# Patient Record
Sex: Female | Born: 1970 | Race: White | Hispanic: No | Marital: Married | Smoking: Former smoker
Health system: Southern US, Community
[De-identification: ages and names within clinical notes are randomized; demographics above are authoritative.]

## PROBLEM LIST (undated history)

## (undated) DIAGNOSIS — K219 Gastro-esophageal reflux disease without esophagitis: Secondary | ICD-10-CM

## (undated) DIAGNOSIS — R51 Headache: Secondary | ICD-10-CM

## (undated) DIAGNOSIS — R519 Headache, unspecified: Secondary | ICD-10-CM

## (undated) DIAGNOSIS — D649 Anemia, unspecified: Secondary | ICD-10-CM

---

## 2009-10-03 HISTORY — PX: ANTERIOR CRUCIATE LIGAMENT REPAIR: SHX115

## 2010-02-26 ENCOUNTER — Ambulatory Visit: Payer: Self-pay | Admitting: Obstetrics and Gynecology

## 2010-12-21 ENCOUNTER — Ambulatory Visit: Payer: Self-pay

## 2011-01-24 ENCOUNTER — Ambulatory Visit: Payer: Self-pay | Admitting: Unknown Physician Specialty

## 2011-11-25 ENCOUNTER — Ambulatory Visit: Payer: Self-pay | Admitting: Obstetrics and Gynecology

## 2012-11-20 ENCOUNTER — Ambulatory Visit: Payer: Self-pay | Admitting: Internal Medicine

## 2014-07-30 ENCOUNTER — Ambulatory Visit: Payer: Self-pay | Admitting: Obstetrics and Gynecology

## 2015-12-24 ENCOUNTER — Other Ambulatory Visit: Payer: Self-pay | Admitting: Physical Medicine and Rehabilitation

## 2015-12-24 DIAGNOSIS — M5412 Radiculopathy, cervical region: Secondary | ICD-10-CM

## 2016-01-07 ENCOUNTER — Ambulatory Visit
Admission: RE | Admit: 2016-01-07 | Discharge: 2016-01-07 | Disposition: A | Discharge status: Home / Self Care | Payer: Managed Care, Other (non HMO) | Source: Ambulatory Visit | Attending: Physical Medicine and Rehabilitation | Admitting: Physical Medicine and Rehabilitation

## 2016-01-07 DIAGNOSIS — M4802 Spinal stenosis, cervical region: Secondary | ICD-10-CM | POA: Diagnosis not present

## 2016-01-07 DIAGNOSIS — M5412 Radiculopathy, cervical region: Secondary | ICD-10-CM

## 2016-01-07 DIAGNOSIS — M50221 Other cervical disc displacement at C4-C5 level: Secondary | ICD-10-CM | POA: Insufficient documentation

## 2016-08-11 ENCOUNTER — Other Ambulatory Visit: Payer: Self-pay | Admitting: Obstetrics and Gynecology

## 2016-08-11 DIAGNOSIS — Z1231 Encounter for screening mammogram for malignant neoplasm of breast: Secondary | ICD-10-CM

## 2016-08-15 ENCOUNTER — Ambulatory Visit
Admission: RE | Admit: 2016-08-15 | Discharge: 2016-08-15 | Disposition: A | Discharge status: Home / Self Care | Payer: Managed Care, Other (non HMO) | Source: Ambulatory Visit | Attending: Obstetrics and Gynecology | Admitting: Obstetrics and Gynecology

## 2016-08-15 ENCOUNTER — Encounter (INDEPENDENT_AMBULATORY_CARE_PROVIDER_SITE_OTHER): Payer: Self-pay

## 2016-08-15 DIAGNOSIS — Z1231 Encounter for screening mammogram for malignant neoplasm of breast: Secondary | ICD-10-CM | POA: Diagnosis present

## 2017-10-30 ENCOUNTER — Other Ambulatory Visit: Payer: Self-pay | Admitting: Orthopedic Surgery

## 2017-11-01 ENCOUNTER — Other Ambulatory Visit: Payer: Self-pay

## 2017-11-01 ENCOUNTER — Encounter (HOSPITAL_COMMUNITY): Payer: Self-pay | Admitting: *Deleted

## 2017-11-01 MED ORDER — CEFAZOLIN SODIUM 10 G IJ SOLR
3.0000 g | INTRAMUSCULAR | Status: DC
  Filled 2017-11-01: qty 3000

## 2017-11-02 ENCOUNTER — Ambulatory Visit (HOSPITAL_COMMUNITY): Payer: Managed Care, Other (non HMO) | Admitting: Anesthesiology

## 2017-11-02 ENCOUNTER — Ambulatory Visit (HOSPITAL_COMMUNITY)
Admission: RE | Admit: 2017-11-02 | Discharge: 2017-11-02 | Disposition: A | Discharge status: Home / Self Care | Payer: Managed Care, Other (non HMO) | Source: Ambulatory Visit | Attending: Orthopedic Surgery | Admitting: Orthopedic Surgery

## 2017-11-02 ENCOUNTER — Ambulatory Visit (HOSPITAL_COMMUNITY)
Admission: RE | Disposition: A | Discharge status: Home / Self Care | Payer: Self-pay | Source: Ambulatory Visit | Attending: Orthopedic Surgery

## 2017-11-02 ENCOUNTER — Encounter (HOSPITAL_COMMUNITY): Payer: Self-pay

## 2017-11-02 ENCOUNTER — Ambulatory Visit (HOSPITAL_COMMUNITY): Payer: Managed Care, Other (non HMO)

## 2017-11-02 DIAGNOSIS — Z803 Family history of malignant neoplasm of breast: Secondary | ICD-10-CM | POA: Insufficient documentation

## 2017-11-02 DIAGNOSIS — Z87891 Personal history of nicotine dependence: Secondary | ICD-10-CM | POA: Diagnosis not present

## 2017-11-02 DIAGNOSIS — Z91013 Allergy to seafood: Secondary | ICD-10-CM | POA: Diagnosis not present

## 2017-11-02 DIAGNOSIS — M5116 Intervertebral disc disorders with radiculopathy, lumbar region: Secondary | ICD-10-CM | POA: Diagnosis present

## 2017-11-02 DIAGNOSIS — Z9889 Other specified postprocedural states: Secondary | ICD-10-CM | POA: Diagnosis not present

## 2017-11-02 DIAGNOSIS — Z91018 Allergy to other foods: Secondary | ICD-10-CM | POA: Diagnosis not present

## 2017-11-02 DIAGNOSIS — Z419 Encounter for procedure for purposes other than remedying health state, unspecified: Secondary | ICD-10-CM

## 2017-11-02 DIAGNOSIS — Z01818 Encounter for other preprocedural examination: Secondary | ICD-10-CM

## 2017-11-02 DIAGNOSIS — M5410 Radiculopathy, site unspecified: Secondary | ICD-10-CM

## 2017-11-02 HISTORY — DX: Headache, unspecified: R51.9

## 2017-11-02 HISTORY — PX: LUMBAR LAMINECTOMY/DECOMPRESSION MICRODISCECTOMY: SHX5026

## 2017-11-02 HISTORY — DX: Anemia, unspecified: D64.9

## 2017-11-02 HISTORY — DX: Headache: R51

## 2017-11-02 HISTORY — DX: Gastro-esophageal reflux disease without esophagitis: K21.9

## 2017-11-02 LAB — CBC WITH DIFFERENTIAL/PLATELET
Basophils Absolute: 0 10*3/uL (ref 0.0–0.1)
Basophils Relative: 0 %
EOS PCT: 1 %
Eosinophils Absolute: 0.1 10*3/uL (ref 0.0–0.7)
HCT: 43.8 % (ref 36.0–46.0)
Hemoglobin: 14.5 g/dL (ref 12.0–15.0)
LYMPHS ABS: 2 10*3/uL (ref 0.7–4.0)
LYMPHS PCT: 24 %
MCH: 30.5 pg (ref 26.0–34.0)
MCHC: 33.1 g/dL (ref 30.0–36.0)
MCV: 92 fL (ref 78.0–100.0)
MONO ABS: 0.5 10*3/uL (ref 0.1–1.0)
MONOS PCT: 5 %
Neutro Abs: 5.9 10*3/uL (ref 1.7–7.7)
Neutrophils Relative %: 70 %
PLATELETS: 335 10*3/uL (ref 150–400)
RBC: 4.76 MIL/uL (ref 3.87–5.11)
RDW: 12.9 % (ref 11.5–15.5)
WBC: 8.5 10*3/uL (ref 4.0–10.5)

## 2017-11-02 LAB — SURGICAL PCR SCREEN
MRSA, PCR: NEGATIVE
STAPHYLOCOCCUS AUREUS: NEGATIVE

## 2017-11-02 LAB — COMPREHENSIVE METABOLIC PANEL
ALT: 15 U/L (ref 14–54)
AST: 18 U/L (ref 15–41)
Albumin: 4.1 g/dL (ref 3.5–5.0)
Alkaline Phosphatase: 70 U/L (ref 38–126)
Anion gap: 10 (ref 5–15)
BUN: 11 mg/dL (ref 6–20)
CALCIUM: 9.2 mg/dL (ref 8.9–10.3)
CO2: 24 mmol/L (ref 22–32)
CREATININE: 0.96 mg/dL (ref 0.44–1.00)
Chloride: 104 mmol/L (ref 101–111)
Glucose, Bld: 96 mg/dL (ref 65–99)
Potassium: 3.6 mmol/L (ref 3.5–5.1)
Sodium: 138 mmol/L (ref 135–145)
Total Bilirubin: 0.7 mg/dL (ref 0.3–1.2)
Total Protein: 6.8 g/dL (ref 6.5–8.1)

## 2017-11-02 LAB — URINALYSIS, ROUTINE W REFLEX MICROSCOPIC
Bilirubin Urine: NEGATIVE
Glucose, UA: NEGATIVE mg/dL
KETONES UR: NEGATIVE mg/dL
Leukocytes, UA: NEGATIVE
Nitrite: NEGATIVE
PROTEIN: NEGATIVE mg/dL
Specific Gravity, Urine: 1.028 (ref 1.005–1.030)
pH: 5 (ref 5.0–8.0)

## 2017-11-02 LAB — APTT: aPTT: 37 seconds — ABNORMAL HIGH (ref 24–36)

## 2017-11-02 LAB — TYPE AND SCREEN
ABO/RH(D): O POS
Antibody Screen: NEGATIVE

## 2017-11-02 LAB — PROTIME-INR
INR: 0.99
PROTHROMBIN TIME: 13 s (ref 11.4–15.2)

## 2017-11-02 LAB — ABO/RH: ABO/RH(D): O POS

## 2017-11-02 LAB — POCT PREGNANCY, URINE: Preg Test, Ur: NEGATIVE

## 2017-11-02 SURGERY — LUMBAR LAMINECTOMY/DECOMPRESSION MICRODISCECTOMY
Anesthesia: General

## 2017-11-02 MED ORDER — BUPIVACAINE-EPINEPHRINE (PF) 0.5% -1:200000 IJ SOLN
INTRAMUSCULAR | Status: AC
  Filled 2017-11-02: qty 30

## 2017-11-02 MED ORDER — FENTANYL CITRATE (PF) 250 MCG/5ML IJ SOLN
INTRAMUSCULAR | Status: AC
  Filled 2017-11-02: qty 5

## 2017-11-02 MED ORDER — PROPOFOL 10 MG/ML IV BOLUS
INTRAVENOUS | Status: DC | PRN
  Administered 2017-11-02: 50 mg via INTRAVENOUS
  Administered 2017-11-02: 150 mg via INTRAVENOUS

## 2017-11-02 MED ORDER — MIDAZOLAM HCL 5 MG/5ML IJ SOLN
INTRAMUSCULAR | Status: DC | PRN
  Administered 2017-11-02: 2 mg via INTRAVENOUS

## 2017-11-02 MED ORDER — MUPIROCIN 2 % EX OINT
TOPICAL_OINTMENT | CUTANEOUS | Status: AC
  Administered 2017-11-02: 1 via TOPICAL
  Filled 2017-11-02: qty 22

## 2017-11-02 MED ORDER — CHLORHEXIDINE GLUCONATE 4 % EX LIQD: 60.0000 mL | Freq: Once | CUTANEOUS | Status: DC

## 2017-11-02 MED ORDER — MIDAZOLAM HCL 2 MG/2ML IJ SOLN
INTRAMUSCULAR | Status: AC
  Filled 2017-11-02: qty 2

## 2017-11-02 MED ORDER — ONDANSETRON HCL 4 MG/2ML IJ SOLN
INTRAMUSCULAR | Status: AC
  Filled 2017-11-02: qty 2

## 2017-11-02 MED ORDER — SURGIFOAM 100 EX MISC
CUTANEOUS | Status: DC | PRN
  Administered 2017-11-02: 16:00:00 via TOPICAL

## 2017-11-02 MED ORDER — MUPIROCIN 2 % EX OINT
1.0000 "application " | TOPICAL_OINTMENT | Freq: Once | CUTANEOUS | Status: AC
  Administered 2017-11-02: 1 via TOPICAL

## 2017-11-02 MED ORDER — FENTANYL CITRATE (PF) 100 MCG/2ML IJ SOLN
25.0000 ug | INTRAMUSCULAR | Status: DC | PRN
  Administered 2017-11-02 (×2): 50 ug via INTRAVENOUS

## 2017-11-02 MED ORDER — LIDOCAINE 2% (20 MG/ML) 5 ML SYRINGE
INTRAMUSCULAR | Status: AC
  Filled 2017-11-02: qty 5

## 2017-11-02 MED ORDER — FENTANYL CITRATE (PF) 100 MCG/2ML IJ SOLN
INTRAMUSCULAR | Status: AC
  Filled 2017-11-02: qty 2

## 2017-11-02 MED ORDER — FENTANYL CITRATE (PF) 100 MCG/2ML IJ SOLN
INTRAMUSCULAR | Status: DC | PRN
  Administered 2017-11-02 (×2): 50 ug via INTRAVENOUS
  Administered 2017-11-02: 150 ug via INTRAVENOUS
  Administered 2017-11-02: 50 ug via INTRAVENOUS

## 2017-11-02 MED ORDER — ACETAMINOPHEN 10 MG/ML IV SOLN
1000.0000 mg | INTRAVENOUS | Status: AC
  Administered 2017-11-02: 1000 mg via INTRAVENOUS
  Filled 2017-11-02: qty 100

## 2017-11-02 MED ORDER — LIDOCAINE HCL (CARDIAC) 20 MG/ML IV SOLN
INTRAVENOUS | Status: DC | PRN
  Administered 2017-11-02: 100 mg via INTRAVENOUS

## 2017-11-02 MED ORDER — HEMOSTATIC AGENTS (NO CHARGE) OPTIME
TOPICAL | Status: DC | PRN
  Administered 2017-11-02: 1 via TOPICAL

## 2017-11-02 MED ORDER — METHYLENE BLUE 0.5 % INJ SOLN
INTRAVENOUS | Status: DC | PRN
  Administered 2017-11-02: .03 mL

## 2017-11-02 MED ORDER — SUGAMMADEX SODIUM 200 MG/2ML IV SOLN
INTRAVENOUS | Status: DC | PRN
  Administered 2017-11-02: 206.8 mg via INTRAVENOUS

## 2017-11-02 MED ORDER — METHYLPREDNISOLONE ACETATE 40 MG/ML IJ SUSP
INTRAMUSCULAR | Status: DC | PRN
  Administered 2017-11-02: 40 mg

## 2017-11-02 MED ORDER — DEXAMETHASONE SODIUM PHOSPHATE 10 MG/ML IJ SOLN
INTRAMUSCULAR | Status: AC
  Filled 2017-11-02: qty 1

## 2017-11-02 MED ORDER — ONDANSETRON HCL 4 MG/2ML IJ SOLN
INTRAMUSCULAR | Status: DC | PRN
  Administered 2017-11-02: 4 mg via INTRAVENOUS

## 2017-11-02 MED ORDER — THROMBIN 20000 UNITS EX SOLR
CUTANEOUS | Status: AC
  Filled 2017-11-02: qty 20000

## 2017-11-02 MED ORDER — BUPIVACAINE-EPINEPHRINE 0.5% -1:200000 IJ SOLN
INTRAMUSCULAR | Status: DC | PRN
  Administered 2017-11-02: 10 mL

## 2017-11-02 MED ORDER — CEFAZOLIN SODIUM-DEXTROSE 2-3 GM-%(50ML) IV SOLR
INTRAVENOUS | Status: DC | PRN
Start: 2017-11-02 — End: 2017-11-02
  Administered 2017-11-02: 2 g via INTRAVENOUS

## 2017-11-02 MED ORDER — SCOPOLAMINE 1 MG/3DAYS TD PT72
MEDICATED_PATCH | TRANSDERMAL | Status: AC
  Filled 2017-11-02: qty 1

## 2017-11-02 MED ORDER — METHYLPREDNISOLONE ACETATE 40 MG/ML IJ SUSP
INTRAMUSCULAR | Status: AC
  Filled 2017-11-02: qty 1

## 2017-11-02 MED ORDER — DEXAMETHASONE SODIUM PHOSPHATE 10 MG/ML IJ SOLN
INTRAMUSCULAR | Status: DC | PRN
  Administered 2017-11-02: 10 mg via INTRAVENOUS

## 2017-11-02 MED ORDER — BUPIVACAINE LIPOSOME 1.3 % IJ SUSP
20.0000 mL | INTRAMUSCULAR | Status: DC
  Filled 2017-11-02: qty 20

## 2017-11-02 MED ORDER — ROCURONIUM BROMIDE 100 MG/10ML IV SOLN
INTRAVENOUS | Status: DC | PRN
  Administered 2017-11-02: 50 mg via INTRAVENOUS

## 2017-11-02 MED ORDER — BUPIVACAINE LIPOSOME 1.3 % IJ SUSP
INTRAMUSCULAR | Status: DC | PRN
  Administered 2017-11-02: 20 mL

## 2017-11-02 MED ORDER — LACTATED RINGERS IV SOLN
INTRAVENOUS | Status: DC
  Administered 2017-11-02 (×2): via INTRAVENOUS

## 2017-11-02 MED ORDER — METHYLENE BLUE 0.5 % INJ SOLN
INTRAVENOUS | Status: AC
  Filled 2017-11-02: qty 10

## 2017-11-02 MED ORDER — PROPOFOL 10 MG/ML IV BOLUS
INTRAVENOUS | Status: AC
  Filled 2017-11-02: qty 40

## 2017-11-02 SURGICAL SUPPLY — 71 items
BENZOIN TINCTURE PRP APPL 2/3 (GAUZE/BANDAGES/DRESSINGS) ×3 IMPLANT
BUR NEURO DRILL SOFT 3.0X3.8M (BURR) ×3 IMPLANT
BUR ROUND PRECISION 4.0 (BURR) IMPLANT
BUR ROUND PRECISION 4.0MM (BURR)
CANISTER SUCT 3000ML PPV (MISCELLANEOUS) ×3 IMPLANT
CARTRIDGE OIL MAESTRO DRILL (MISCELLANEOUS) ×1 IMPLANT
CLOSURE WOUND 1/2 X4 (GAUZE/BANDAGES/DRESSINGS) ×1
CORDS BIPOLAR (ELECTRODE) ×3 IMPLANT
COVER SURGICAL LIGHT HANDLE (MISCELLANEOUS) ×3 IMPLANT
DIFFUSER DRILL AIR PNEUMATIC (MISCELLANEOUS) ×3 IMPLANT
DRAIN CHANNEL 15F RND FF W/TCR (WOUND CARE) IMPLANT
DRAPE POUCH INSTRU U-SHP 10X18 (DRAPES) ×6 IMPLANT
DRAPE SURG 17X23 STRL (DRAPES) ×12 IMPLANT
DURAPREP 26ML APPLICATOR (WOUND CARE) ×3 IMPLANT
ELECT BLADE 4.0 EZ CLEAN MEGAD (MISCELLANEOUS) ×3
ELECT CAUTERY BLADE 6.4 (BLADE) ×3 IMPLANT
ELECT REM PT RETURN 9FT ADLT (ELECTROSURGICAL) ×3
ELECTRODE BLDE 4.0 EZ CLN MEGD (MISCELLANEOUS) ×1 IMPLANT
ELECTRODE REM PT RTRN 9FT ADLT (ELECTROSURGICAL) ×1 IMPLANT
EVACUATOR SILICONE 100CC (DRAIN) IMPLANT
FILTER STRAW FLUID ASPIR (MISCELLANEOUS) ×3 IMPLANT
GAUZE SPONGE 4X4 12PLY STRL (GAUZE/BANDAGES/DRESSINGS) ×3 IMPLANT
GAUZE SPONGE 4X4 16PLY XRAY LF (GAUZE/BANDAGES/DRESSINGS) ×6 IMPLANT
GLOVE BIO SURGEON STRL SZ7 (GLOVE) ×3 IMPLANT
GLOVE BIO SURGEON STRL SZ8 (GLOVE) ×3 IMPLANT
GLOVE BIOGEL PI IND STRL 7.0 (GLOVE) ×1 IMPLANT
GLOVE BIOGEL PI IND STRL 8 (GLOVE) ×1 IMPLANT
GLOVE BIOGEL PI INDICATOR 7.0 (GLOVE) ×2
GLOVE BIOGEL PI INDICATOR 8 (GLOVE) ×2
GOWN STRL REUS W/ TWL LRG LVL3 (GOWN DISPOSABLE) ×3 IMPLANT
GOWN STRL REUS W/ TWL XL LVL3 (GOWN DISPOSABLE) ×2 IMPLANT
GOWN STRL REUS W/TWL LRG LVL3 (GOWN DISPOSABLE) ×6
GOWN STRL REUS W/TWL XL LVL3 (GOWN DISPOSABLE) ×4
IV CATH 14GX2 1/4 (CATHETERS) ×3 IMPLANT
KIT BASIN OR (CUSTOM PROCEDURE TRAY) ×3 IMPLANT
KIT POSITION SURG JACKSON T1 (MISCELLANEOUS) ×3 IMPLANT
KIT ROOM TURNOVER OR (KITS) ×3 IMPLANT
NEEDLE 18GX1X1/2 (RX/OR ONLY) (NEEDLE) ×3 IMPLANT
NEEDLE 22X1 1/2 (OR ONLY) (NEEDLE) ×3 IMPLANT
NEEDLE HYPO 25GX1X1/2 BEV (NEEDLE) ×3 IMPLANT
NEEDLE SPNL 18GX3.5 QUINCKE PK (NEEDLE) ×6 IMPLANT
NS IRRIG 1000ML POUR BTL (IV SOLUTION) ×3 IMPLANT
OIL CARTRIDGE MAESTRO DRILL (MISCELLANEOUS) ×3
PACK LAMINECTOMY ORTHO (CUSTOM PROCEDURE TRAY) ×3 IMPLANT
PACK UNIVERSAL I (CUSTOM PROCEDURE TRAY) ×3 IMPLANT
PAD ARMBOARD 7.5X6 YLW CONV (MISCELLANEOUS) ×6 IMPLANT
PATTIES SURGICAL .5 X.5 (GAUZE/BANDAGES/DRESSINGS) IMPLANT
PATTIES SURGICAL .5 X1 (DISPOSABLE) ×3 IMPLANT
SPONGE INTESTINAL PEANUT (DISPOSABLE) ×3 IMPLANT
SPONGE SURGIFOAM ABS GEL 100 (HEMOSTASIS) ×3 IMPLANT
SPONGE SURGIFOAM ABS GEL SZ50 (HEMOSTASIS) ×3 IMPLANT
STRIP CLOSURE SKIN 1/2X4 (GAUZE/BANDAGES/DRESSINGS) ×2 IMPLANT
SURGIFLO W/THROMBIN 8M KIT (HEMOSTASIS) IMPLANT
SUT MNCRL AB 4-0 PS2 18 (SUTURE) ×3 IMPLANT
SUT VIC AB 0 CT1 18XCR BRD 8 (SUTURE) IMPLANT
SUT VIC AB 0 CT1 27 (SUTURE) ×2
SUT VIC AB 0 CT1 27XBRD ANBCTR (SUTURE) ×1 IMPLANT
SUT VIC AB 0 CT1 8-18 (SUTURE)
SUT VIC AB 1 CT1 18XCR BRD 8 (SUTURE) ×1 IMPLANT
SUT VIC AB 1 CT1 8-18 (SUTURE) ×2
SUT VIC AB 2-0 CT2 18 VCP726D (SUTURE) ×3 IMPLANT
SYR 20CC LL (SYRINGE) ×3 IMPLANT
SYR BULB IRRIGATION 50ML (SYRINGE) ×3 IMPLANT
SYR CONTROL 10ML LL (SYRINGE) ×6 IMPLANT
SYR TB 1ML 26GX3/8 SAFETY (SYRINGE) ×6 IMPLANT
SYR TB 1ML LUER SLIP (SYRINGE) ×6 IMPLANT
TAPE CLOTH SURG 4X10 WHT LF (GAUZE/BANDAGES/DRESSINGS) ×3 IMPLANT
TOWEL OR 17X24 6PK STRL BLUE (TOWEL DISPOSABLE) ×3 IMPLANT
TOWEL OR 17X26 10 PK STRL BLUE (TOWEL DISPOSABLE) ×3 IMPLANT
WATER STERILE IRR 1000ML POUR (IV SOLUTION) ×3 IMPLANT
YANKAUER SUCT BULB TIP NO VENT (SUCTIONS) ×3 IMPLANT

## 2017-11-02 NOTE — H&P (Signed)
PREOPERATIVE H&P  Chief Complaint: Right leg pain  HPI: Toni Ramos is a 47 y.o. female who presents with ongoing pain in the right leg  MRI reveals a large extraforaminal right L3/4 HNP  Patient has failed multiple forms of conservative care and continues to have pain (see office notes for additional details regarding the patient's full course of treatment)  Past Medical History:  Diagnosis Date  . Anemia    h/o 2006  . GERD (gastroesophageal reflux disease)    occasionally due to food  . Headache    Past Surgical History:  Procedure Laterality Date  . ANTERIOR CRUCIATE LIGAMENT REPAIR Right 2011  . CESAREAN SECTION     Social History   Socioeconomic History  . Marital status: Married    Spouse name: None  . Number of children: None  . Years of education: None  . Highest education level: None  Social Needs  . Financial resource strain: None  . Food insecurity - worry: None  . Food insecurity - inability: None  . Transportation needs - medical: None  . Transportation needs - non-medical: None  Occupational History  . None  Tobacco Use  . Smoking status: Former Smoker    Types: Cigarettes    Last attempt to quit: 11/02/2007    Years since quitting: 10.0  . Smokeless tobacco: Never Used  Substance and Sexual Activity  . Alcohol use: No    Frequency: Never  . Drug use: No  . Sexual activity: None  Other Topics Concern  . None  Social History Narrative  . None   Family History  Problem Relation Age of Onset  . Breast cancer Maternal Aunt 50  . Breast cancer Maternal Grandmother [phone]   Allergies  Allergen Reactions  . Shellfish Allergy Hives  . Onion Rash    Lumps on tongue   . Other Other (See Comments)    Peppers - bumps on tongue  . Tomato Other (See Comments)    flushed   Prior to Admission medications   Medication Sig Start Date End Date Taking? Authorizing Provider  acetaminophen-codeine (TYLENOL #3) 300-30 MG tablet Take 1 tablet by  mouth 2 (two) times daily as needed for moderate pain.   Yes [provider]  cetirizine (ZYRTEC) 10 MG tablet Take 10 mg by mouth daily as needed for allergies.   Yes [provider]  cyclobenzaprine (FLEXERIL) 5 MG tablet Take 5 mg by mouth daily as needed for muscle spasms.   Yes [provider]  metaxalone (SKELAXIN) 800 MG tablet Take 800 mg by mouth 2 (two) times daily as needed for muscle spasms.   Yes [provider]  oxyCODONE-acetaminophen (PERCOCET/ROXICET) 5-325 MG tablet Take 1 tablet by mouth at bedtime as needed for severe pain.   Yes [provider]  ranitidine (ZANTAC) 150 MG tablet Take 150 mg by mouth daily as needed for heartburn.   Yes [provider]     All other systems have been reviewed and were otherwise negative with the exception of those mentioned in the HPI and as above.  Physical Exam: There were no vitals filed for this visit.  There is no height or weight on file to calculate BMI.  General: Alert, no acute distress Cardiovascular: No pedal edema Respiratory: No cyanosis, no use of accessory musculature Skin: No lesions in the area of chief complaint Neurologic: Sensation intact distally Psychiatric: Patient is competent for consent with normal mood and affect Lymphatic: No axillary  or cervical lymphadenopathy  MUSCULOSKELETAL: + SLR on the right  Assessment/Plan: RIGHT LEG PAIN Plan for Procedure(s): LUMBAR 3-4 DECOMPRESSION REQUESTED TIME 2.5 HRS IN A FLIP ROOM   Emilee Hero, MD 11/02/2017 7:09 AM

## 2017-11-02 NOTE — Anesthesia Preprocedure Evaluation (Addendum)
Anesthesia Evaluation  Patient identified by MRN, date of birth, ID band Patient awake    Reviewed: Allergy & Precautions, H&P , NPO status , Patient's Chart, lab work & pertinent test results, reviewed documented beta blocker date and time   Airway Mallampati: II  TM Distance: >3 FB Neck ROM: full    Dental no notable dental hx. (+) Teeth Intact, Dental Advisory Given   Pulmonary former smoker,    Pulmonary exam normal breath sounds clear to auscultation       Cardiovascular negative cardio ROS   Rhythm:regular Rate:Normal     Neuro/Psych    GI/Hepatic Neg liver ROS,   Endo/Other  Morbid obesity  Renal/GU negative Renal ROS     Musculoskeletal   Abdominal   Peds  Hematology   Anesthesia Other Findings   Reproductive/Obstetrics negative OB ROS                            Anesthesia Physical Anesthesia Plan  ASA: II  Anesthesia Plan: General   Post-op Pain Management:    Induction: Intravenous  PONV Risk Score and Plan: 2 and Dexamethasone, Ondansetron and Treatment may vary due to age or medical condition  Airway Management Planned: Oral ETT  Additional Equipment:   Intra-op Plan:   Post-operative Plan: Extubation in OR  Informed Consent: I have reviewed the patients History and Physical, chart, labs and discussed the procedure including the risks, benefits and alternatives for the proposed anesthesia with the patient or authorized representative who has indicated his/her understanding and acceptance.   Dental Advisory Given  Plan Discussed with: CRNA and Surgeon  Anesthesia Plan Comments: (  )        Anesthesia Quick Evaluation

## 2017-11-02 NOTE — Transfer of Care (Signed)
Immediate Anesthesia Transfer of Care Note  Patient: Toni Ramos  Procedure(s) Performed: LUMBAR 3-4 DECOMPRESSION REQUESTED TIME 2.5 HRS IN A FLIP ROOM (N/A )  Patient Location: PACU  Anesthesia Type:General  Level of Consciousness: drowsy and patient cooperative  Airway & Oxygen Therapy: Patient Spontanous Breathing and Patient connected to nasal cannula oxygen  Post-op Assessment: Report given to RN, Post -op Vital signs reviewed and stable and Patient moving all extremities X 4  Post vital signs: Reviewed and stable  Last Vitals:  Vitals:   11/02/17 1006 11/02/17 1710  BP: (!) 162/99   Pulse:  (!) 114  Resp:  17  Temp:  (!) 36.4 C  SpO2:  97%    Last Pain:  Vitals:   11/02/17 1710  TempSrc:   PainSc: Asleep         Complications: No apparent anesthesia complications

## 2017-11-02 NOTE — Anesthesia Postprocedure Evaluation (Signed)
Anesthesia Post Note  Patient: Toni Ramos  Procedure(s) Performed: LUMBAR 3-4 DECOMPRESSION REQUESTED TIME 2.5 HRS IN A FLIP ROOM (N/A )     Patient location during evaluation: PACU Anesthesia Type: General Level of consciousness: sedated, patient cooperative and oriented Pain management: pain level controlled Vital Signs Assessment: post-procedure vital signs reviewed and stable Respiratory status: spontaneous breathing, nonlabored ventilation, respiratory function stable and patient connected to nasal cannula oxygen Cardiovascular status: blood pressure returned to baseline and stable Postop Assessment: no apparent nausea or vomiting Anesthetic complications: no    Last Vitals:  Vitals:   11/02/17 1756 11/02/17 1805  BP: (!) 151/92 (!) 158/99  Pulse: 97 (!) 114  Resp: (!) 7 18  Temp:  (!) 36.3 C  SpO2: 98% 96%    Last Pain:  Vitals:   11/02/17 1805  TempSrc:   PainSc: Asleep                 Basem Yannuzzi,E. Chatara Lucente

## 2017-11-02 NOTE — Anesthesia Procedure Notes (Signed)
Procedure Name: Intubation Date/Time: 11/02/2017 2:03 PM Performed by: Jenne Campus, CRNA Pre-anesthesia Checklist: Patient identified, Emergency Drugs available, Suction available and Patient being monitored Patient Re-evaluated:Patient Re-evaluated prior to induction Oxygen Delivery Method: Circle System Utilized Preoxygenation: Pre-oxygenation with 100% oxygen Induction Type: IV induction Ventilation: Mask ventilation without difficulty Grade View: Grade II Tube type: Oral Tube size: 7.0 mm Number of attempts: 1 Airway Equipment and Method: Oral airway,  Video-laryngoscopy and Rigid stylet Placement Confirmation: ETT inserted through vocal cords under direct vision,  positive ETCO2 and breath sounds checked- equal and bilateral Secured at: 21 cm Tube secured with: Tape Dental Injury: Teeth and Oropharynx as per pre-operative assessment  Difficulty Due To: Difficult Airway- due to anterior larynx Comments: Smooth IV induction. Easy mask. DL x 1 Mil 2. Epiglottis dropping from blade. DL x 1 Mil 3. Grade 4 view. Video-glide MAC 4. Grade 2 view. Atraumatic intub. +ETCO2. BBSE.

## 2017-11-03 ENCOUNTER — Encounter (HOSPITAL_COMMUNITY): Payer: Self-pay | Admitting: Orthopedic Surgery

## 2017-11-03 MED FILL — Thrombin For Soln 20000 Unit: CUTANEOUS | Qty: 1 | Status: AC

## 2017-11-03 NOTE — Op Note (Signed)
[name], [name] NO.:  [id number]  MEDICAL RECORD NO.:  [id number]  LOCATION:  [facility]                         FACILITY:  [facility]  PHYSICIAN:  Estill Bamberg, MD      DATE OF BIRTH:  10/23/1970  DATE OF PROCEDURE:  11/02/2017                              OPERATIVE REPORT   PREOPERATIVE DIAGNOSES: 1. Right-sided L3 radiculopathy. 2. Right-sided L3-4 foraminal disk herniation.  POSTOPERATIVE DIAGNOSES: 1. Right-sided L3 radiculopathy. 2. Right-sided L3-4 foraminal disk herniation.  PROCEDURE:  Right-sided L3-4 transpedicular decompression via a Wiltse approach, with removal of a foraminal right-sided L3-4 disk herniation.  SURGEON:  Estill Bamberg, MD.  ASSISTANTJason Coop, PA-C.  ANESTHESIA:  General endotracheal anesthesia.  COMPLICATIONS:  None.  DISPOSITION:  Stable.  ESTIMATED BLOOD LOSS:  Minimal.  INDICATIONS FOR SURGERY:  Briefly, Toni Ramos is a very pleasant 47 year old female, who did present to me with substantial pain in the right anterior side, consistent with right L3 radiculopathy.  Her MRI did reveal the findings noted above.  She did fail appropriate conservative care, but did continue to have ongoing pain and weakness.  Given her ongoing dysfunction, we did discuss proceeding with the procedure noted above.  She did wish to proceed.  DESCRIPTION OF PROCEDURE:  On 11/02/2017, the patient was brought to Surgery and general endotracheal anesthesia was administered.  The patient was placed prone on a well-padded flat Jackson bed with a spinal frame.  Antibiotics were given.  The back was prepped and draped in the usual sterile fashion and a time-out procedure was performed.  A right- sided paramedian incision was made 3 cm to the right side of the midline.  The fascia was incised and a Wiltse approach was used and the right-sided L3-4 facet joint was identified and dilators were placed, centered over the L3-4 facet joint, at its  junction with the right L4 transverse process.  A rigid retractor was then advanced over the dilators, which was attached to the bed using a rigid arm.  I then performed a partial facetectomy and was able to readily identify the exiting right L3 nerve and the intervertebral L3-4 disk space.  At this point, I did superiorly and laterally retracted the right L3 nerve, and in doing so, multiple free disk fragments were identified in the foraminal region superiorly, ventral to the L3 nerve.  Continuing to retract the nerve superiorly and laterally, I did perform an annulotomy and multiple intervertebral disk fragments were removed.  In doing so, I was able to thoroughly decompress the foraminal and extraforaminal region.  I did use a Woodson elevator to follow the exiting right L3 nerve out the foramen, and there was no residual nerve compression identified.  The wound was then copiously irrigated and bleeding was controlled.  20 mg of Depo-Medrol was then introduced about the region of the right L3 nerve.  The wound was then closed in layers using #1 Vicryl, followed by 2-0 Vicryl, followed by 4-0 Monocryl.  Benzoin and Steri-Strips were applied, followed by sterile dressing.  All instrument counts were correct at the termination of the procedure.  Of note, Toni Ramos was my assistant throughout the  surgery and did aid in retraction and suctioning.     Estill BambergMark Toni Wilczak, MD     MD/MEDQ  D:  11/02/2017  T:  11/03/2017  Job:  [id number]

## 2018-03-01 ENCOUNTER — Emergency Department
Admission: EM | Admit: 2018-03-01 | Discharge: 2018-03-01 | Disposition: A | Discharge status: Home / Self Care | Payer: Managed Care, Other (non HMO) | Attending: Student in an Organized Health Care Education/Training Program | Admitting: Student in an Organized Health Care Education/Training Program

## 2018-03-01 ENCOUNTER — Emergency Department: Payer: Managed Care, Other (non HMO)

## 2018-03-01 ENCOUNTER — Encounter: Payer: Self-pay | Admitting: Emergency Medicine

## 2018-03-01 DIAGNOSIS — J3489 Other specified disorders of nose and nasal sinuses: Secondary | ICD-10-CM | POA: Insufficient documentation

## 2018-03-01 DIAGNOSIS — R55 Syncope and collapse: Secondary | ICD-10-CM | POA: Insufficient documentation

## 2018-03-01 DIAGNOSIS — Z87891 Personal history of nicotine dependence: Secondary | ICD-10-CM | POA: Diagnosis not present

## 2018-03-01 LAB — BASIC METABOLIC PANEL
ANION GAP: 10 (ref 5–15)
BUN: 11 mg/dL (ref 6–20)
CALCIUM: 9.2 mg/dL (ref 8.9–10.3)
CHLORIDE: 102 mmol/L (ref 101–111)
CO2: 26 mmol/L (ref 22–32)
Creatinine, Ser: 0.98 mg/dL (ref 0.44–1.00)
GFR calc non Af Amer: >60 mL/min (ref >60)
Glucose, Bld: 116 mg/dL — ABNORMAL HIGH (ref 65–99)
Potassium: 3.5 mmol/L (ref 3.5–5.1)
SODIUM: 138 mmol/L (ref 135–145)

## 2018-03-01 LAB — CBC
HCT: 43.6 % (ref 35.0–47.0)
HEMOGLOBIN: 14.8 g/dL (ref 12.0–16.0)
MCH: 30.9 pg (ref 26.0–34.0)
MCHC: 33.9 g/dL (ref 32.0–36.0)
MCV: 91.3 fL (ref 80.0–100.0)
Platelets: 337 10*3/uL (ref 150–440)
RBC: 4.78 MIL/uL (ref 3.80–5.20)
RDW: 13.2 % (ref 11.5–14.5)
WBC: 10.2 10*3/uL (ref 3.6–11.0)

## 2018-03-01 LAB — TROPONIN I

## 2018-03-01 MED ORDER — AMOXICILLIN-POT CLAVULANATE 875-125 MG PO TABS
1.0000 | ORAL_TABLET | Freq: Once | ORAL | Status: AC
  Administered 2018-03-01: 1 via ORAL
  Filled 2018-03-01: qty 1

## 2018-03-01 MED ORDER — AMOXICILLIN-POT CLAVULANATE 875-125 MG PO TABS: 1.0000 | ORAL_TABLET | Freq: Two times a day (BID) | ORAL | 0 refills | Status: AC

## 2018-03-01 MED ORDER — IOPAMIDOL (ISOVUE-370) INJECTION 76%
75.0000 mL | Freq: Once | INTRAVENOUS | Status: AC | PRN
  Administered 2018-03-01: 75 mL via INTRAVENOUS
  Filled 2018-03-01: qty 75

## 2018-03-01 MED ORDER — SODIUM CHLORIDE 0.9 % IV BOLUS
1000.0000 mL | Freq: Once | INTRAVENOUS | Status: AC
  Administered 2018-03-01: 1000 mL via INTRAVENOUS

## 2018-03-01 NOTE — ED Notes (Signed)
ED Provider at bedside. 

## 2018-03-01 NOTE — ED Notes (Signed)
Charge RN note:  Patient sent over from [facility] c/o syncopal episode last night, was at son's baseball game and think's she became overheated, patient passed out in her car and had one episode of vomiting on herself, states went home and cooled off and felt fine.  Woke up this morning with clear fluid running from her nose, mild headache, no other complaints.

## 2018-03-01 NOTE — ED Notes (Signed)
Pt discharged home after verbalizing understanding of discharge instructions; nad noted. 

## 2018-03-01 NOTE — ED Provider Notes (Signed)
[facility] Emergency Department Provider Note    First MD Initiated Contact with Patient 03/01/18 1406     (approximate)  I have reviewed the triage vital signs and the nursing notes.   HISTORY  Chief Complaint Loss of Consciousness and Headache    HPI Toni Ramos is a 47 y.o. female presents to the ER after having a syncopal episode last night but her primary reason for coming to the ER today is that she started noticing that she was having clear fluid leaking from her nose this morning when she leaned forward.  She denies any headaches right now.  No blurry vision.  No nausea or vomiting.  No abnormal smells or taste.  States that last night she was at her son's baseball game and felt that she was getting overheated and went to close her eyes in the car and then woke up seconds later and had vomited.  Denies any headache at that time.  No numbness or tingling.  States that she went home and cold off and felt better and was primarily worried that she got dehydrated and overheated.  States that she had had a spontaneous CSF leak from her nose previously and had a outpatient LP done with resolution of her symptoms and since has not had any issues.  Did recently have a lumbar spinal surgery.  Denies any fevers.  Otherwise feels well.  Past Medical History:  Diagnosis Date  . Anemia    h/o 2006  . GERD (gastroesophageal reflux disease)    occasionally due to food  . Headache    Family History  Problem Relation Age of Onset  . Breast cancer Maternal Aunt 50  . Breast cancer Maternal Grandmother 63   Past Surgical History:  Procedure Laterality Date  . ANTERIOR CRUCIATE LIGAMENT REPAIR Right 2011  . CESAREAN SECTION    . LUMBAR LAMINECTOMY/DECOMPRESSION MICRODISCECTOMY N/A 11/02/2017   Procedure: LUMBAR 3-4 DECOMPRESSION REQUESTED TIME 2.5 HRS IN A FLIP ROOM;  Surgeon: Estill Bamberg, MD;  Location: [facility] OR;  Service: Orthopedics;  Laterality: N/A;  LUMBAR 3-4  DECOMPRESSION REQUESTED TIME 2.5 HRS IN A FLIP ROOM   There are no active problems to display for this patient.     Prior to Admission medications   Medication Sig Start Date End Date Taking? Authorizing Provider  cetirizine (ZYRTEC) 10 MG tablet Take 10 mg by mouth daily as needed for allergies.    [provider]  metaxalone (SKELAXIN) 800 MG tablet Take 800 mg by mouth 2 (two) times daily as needed for muscle spasms.    [provider]  oxyCODONE-acetaminophen (PERCOCET/ROXICET) 5-325 MG tablet Take 1 tablet by mouth at bedtime as needed for severe pain.    [provider]  ranitidine (ZANTAC) 150 MG tablet Take 150 mg by mouth daily as needed for heartburn.    [provider]    Allergies Shellfish allergy; Onion; Other; and Tomato    Social History Social History   Tobacco Use  . Smoking status: Former Smoker    Types: Cigarettes    Last attempt to quit: 11/02/2007    Years since quitting: 10.3  . Smokeless tobacco: Never Used  Substance Use Topics  . Alcohol use: No    Frequency: Never  . Drug use: No    Review of Systems Patient denies headaches, rhinorrhea, blurry vision, numbness, shortness of breath, chest pain, edema, cough, abdominal pain, nausea, vomiting, diarrhea, dysuria, fevers, rashes or hallucinations unless otherwise stated  above in HPI. ____________________________________________   PHYSICAL EXAM:  VITAL SIGNS: Vitals:   03/01/18 1143 03/01/18 1417  BP: (!) 159/108 (!) 178/107  Pulse: (!) 108 87  Resp: 18 18  Temp: 98.3 F (36.8 C)   SpO2: 97% 100%    Constitutional: Alert and oriented. Well appearing and in no acute distress. Eyes: Conjunctivae are normal.  Head: Atraumatic. Nose: No congestion.  + clear rhinorrhea when leaning forward, no sinus ttp Mouth/Throat: Mucous membranes are moist.   Neck: No stridor. Painless ROM.  Cardiovascular: Normal rate, regular rhythm. Grossly normal heart sounds.   Good peripheral circulation. Respiratory: Normal respiratory effort.  No retractions. Lungs CTAB. Gastrointestinal: Soft and nontender. No distention. No abdominal bruits. No CVA tenderness. Genitourinary: deferred Musculoskeletal: No lower extremity tenderness nor edema.  No joint effusions. Neurologic:  Normal speech and language. No gross focal neurologic deficits are appreciated. No facial droop Skin:  Skin is warm, dry and intact. No rash noted. Psychiatric: Mood and affect are normal. Speech and behavior are normal.  ____________________________________________   LABS (all labs ordered are listed, but only abnormal results are displayed)  Results for orders placed or performed during the hospital encounter of 03/01/18 (from the past 24 hour(s))  Basic metabolic panel     Status: Abnormal   Collection Time: 03/01/18 11:50 AM  Result Value Ref Range   Sodium 138 135 - 145 mmol/L   Potassium 3.5 3.5 - 5.1 mmol/L   Chloride 102 101 - 111 mmol/L   CO2 26 22 - 32 mmol/L   Glucose, Bld 116 (H) 65 - 99 mg/dL   BUN 11 6 - 20 mg/dL   Creatinine, Ser 1.61 0.44 - 1.00 mg/dL   Calcium 9.2 8.9 - 09.6 mg/dL   GFR calc non Af Amer >60 >60 mL/min   GFR calc Af Amer >60 >60 mL/min   Anion gap 10 5 - 15  CBC     Status: None   Collection Time: 03/01/18 11:50 AM  Result Value Ref Range   WBC 10.2 3.6 - 11.0 K/uL   RBC 4.78 3.80 - 5.20 MIL/uL   Hemoglobin 14.8 12.0 - 16.0 g/dL   HCT 04.5 40.9 - 81.1 %   MCV 91.3 80.0 - 100.0 fL   MCH 30.9 26.0 - 34.0 pg   MCHC 33.9 32.0 - 36.0 g/dL   RDW 91.4 78.2 - 95.6 %   Platelets 337 150 - 440 K/uL  Troponin I     Status: None   Collection Time: 03/01/18 11:50 AM  Result Value Ref Range   Troponin I <0.03 <0.03 ng/mL   ____________________________________________  EKG My review and personal interpretation at Time: 11:49   Indication: syncope  Rate: 100  Rhythm: sinus Axis: normal Other: normal intervals, no stemi, unchanged from previous ecg  11/02/17 ____________________________________________  RADIOLOGY  I personally reviewed all radiographic images ordered to evaluate for the above acute complaints and reviewed radiology reports and findings.  These findings were personally discussed with the patient.  Please see medical record for radiology report.  ____________________________________________   PROCEDURES  Procedure(s) performed:  Procedures    Critical Care performed: no ____________________________________________   INITIAL IMPRESSION / ASSESSMENT AND PLAN / ED COURSE  Pertinent labs & imaging results that were available during my care of the patient were reviewed by me and considered in my medical decision making (see chart for details).  DDX: Tumor, pseudotumor, sinusitis, CSF leak, dysrhythmia, aneurysm, mass  Chante Blackley is a 47 y.o.  who presents to the ED with symptoms as described above.  Patient well-appearing is asymptomatic at this time but does have these episodes of anytime she leaves former will have clear nasal drainage.  Possible sinusitis but given her history CT imaging ordered to rule out mass, aneurysm, fistula.  No evidence of mass.  There is evidence of empty sella.  May be component of pseudotumor the patient without any symptoms to indicate emergent LP.  Certainly no evidence of abscess.  No evidence of meningitis clinically.  Blood work otherwise reassuring.  No evidence of dysrhythmia on EKG.  Will touch base with ENT and neurosurgery regarding further recommendations.  Clinical Course as of Mar 01 1656  Thu Mar 01, 2018  1647 Spoke with Dr. Andee Poles of ENT who kindly agrees to see patient in clinic tomorrow.  Has recommended prophylactic antibiotics which I will start here.  Patient remains asymptomatic in no acute distress.  Have discussed with the patient and available family all diagnostics and treatments performed thus far and all questions were answered to the best of my ability. The  patient demonstrates understanding and agreement with plan.    [PR]    Clinical Course User Index [PR] Willy Eddy, MD     As part of my medical decision making, I reviewed the following data within the electronic MEDICAL RECORD NUMBER Nursing notes reviewed and incorporated, Labs reviewed, notes from prior ED visits.   ____________________________________________   FINAL CLINICAL IMPRESSION(S) / ED DIAGNOSES  Final diagnoses:  Rhinorrhea  Syncope, unspecified syncope type      NEW MEDICATIONS STARTED DURING THIS VISIT:  New Prescriptions   No medications on file     Note:  This document was prepared using Dragon voice recognition software and may include unintentional dictation errors.    Willy Eddy, MD 03/01/18 [phone]

## 2018-03-01 NOTE — ED Triage Notes (Signed)
Pt comes into the ED via POV c/o syncopal episode last night, headache, and clear liquid that was present in her nose. Patient states yesterday she was at her sons baseball game yesterday, got hot, vomited on herself and passed out.  Patient then woke up today with headache, and clear fluid from her nose.  Patient has extensive back surgery history.  Patient has no complaints at this time.  Even and unlabored respirations, denies any chest pain, and denies any shortness of breath.

## 2018-05-11 ENCOUNTER — Other Ambulatory Visit: Payer: Self-pay | Admitting: Orthopedic Surgery

## 2018-05-11 DIAGNOSIS — M5416 Radiculopathy, lumbar region: Secondary | ICD-10-CM

## 2018-05-16 ENCOUNTER — Ambulatory Visit
Admission: RE | Admit: 2018-05-16 | Discharge: 2018-05-16 | Disposition: A | Discharge status: Home / Self Care | Payer: Managed Care, Other (non HMO) | Source: Ambulatory Visit | Attending: Orthopedic Surgery | Admitting: Orthopedic Surgery

## 2018-05-16 DIAGNOSIS — M5416 Radiculopathy, lumbar region: Secondary | ICD-10-CM

## 2018-07-02 ENCOUNTER — Other Ambulatory Visit: Payer: Self-pay | Admitting: Obstetrics and Gynecology

## 2018-07-02 DIAGNOSIS — Z1231 Encounter for screening mammogram for malignant neoplasm of breast: Secondary | ICD-10-CM

## 2018-07-19 ENCOUNTER — Ambulatory Visit
Admission: RE | Admit: 2018-07-19 | Discharge: 2018-07-19 | Disposition: A | Discharge status: Home / Self Care | Payer: Managed Care, Other (non HMO) | Source: Ambulatory Visit | Attending: Obstetrics and Gynecology | Admitting: Obstetrics and Gynecology

## 2018-07-19 DIAGNOSIS — Z1231 Encounter for screening mammogram for malignant neoplasm of breast: Secondary | ICD-10-CM | POA: Insufficient documentation

## 2019-01-24 IMAGING — MR MR LUMBAR SPINE W/O CM
4 of 5 series · 24 of 48 positions shown · non-contrast
Comparison: Lumbar MRI 10/23/2017

CLINICAL DATA: Lumbar radiculopathy. Right hip and left thigh pain.
Back surgery 11/02/2017 for right foraminal disc protrusion at L3-4.

EXAM:
MRI LUMBAR SPINE WITHOUT CONTRAST
TECHNIQUE: Multiplanar, multisequence MR imaging of the lumbar spine was
performed. No intravenous contrast was administered.

[Series 2: T2 · sagittal · 4.0mm · 0.73mm/px · 6 of 18 slices shown (1 of 2)]
[im 1/18]
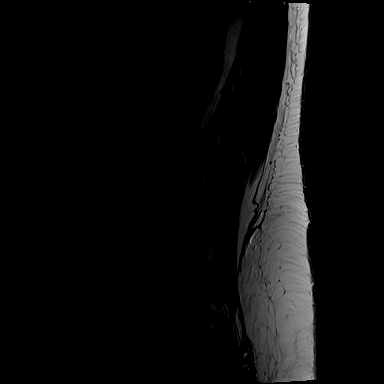
[im 4/18]
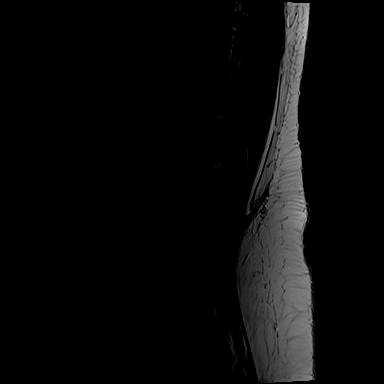
[im 7/18]
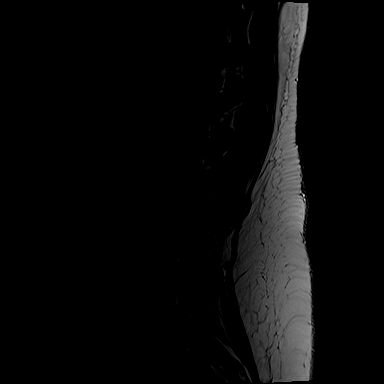
[im 11/18]
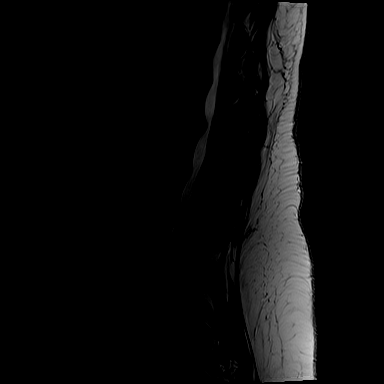
[im 14/18]
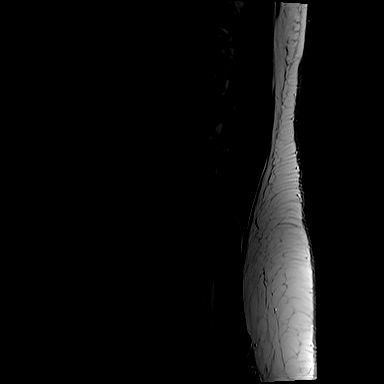
[im 18/18]
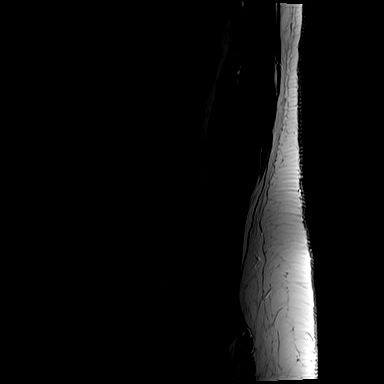

[Series 4: T1 · sagittal · 4.0mm · 1.09mm/px · 7 of 18 slices shown (1 of 2)]
[im 1/18]
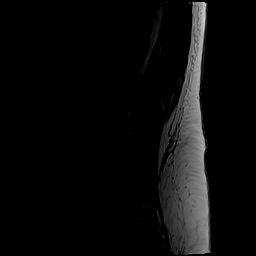
[im 3/18]
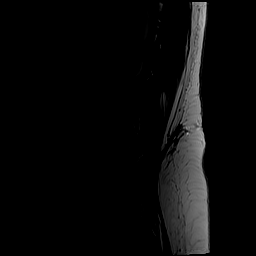
[im 6/18]
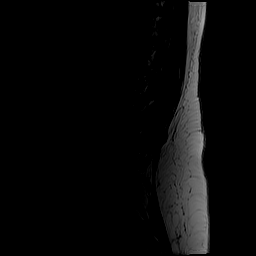
[im 9/18]
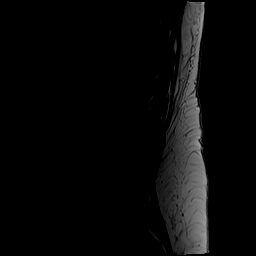
[im 12/18]
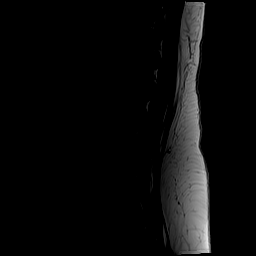
[im 15/18]
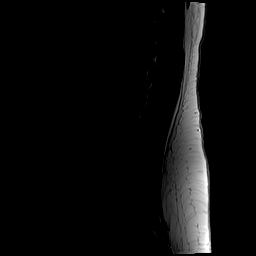
[im 18/18]
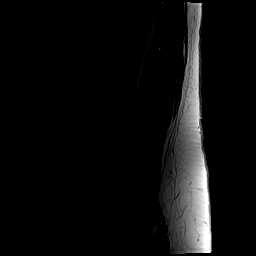

[Series 5: T2 · axial · 4.0mm · 0.39mm/px · z∈[-38,+164]mm · 8 of 39 slices shown (2 of 2)]
[im 1/39]
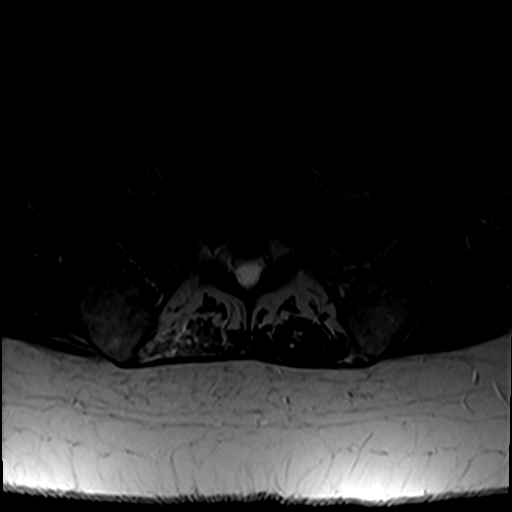
[im 6/39]
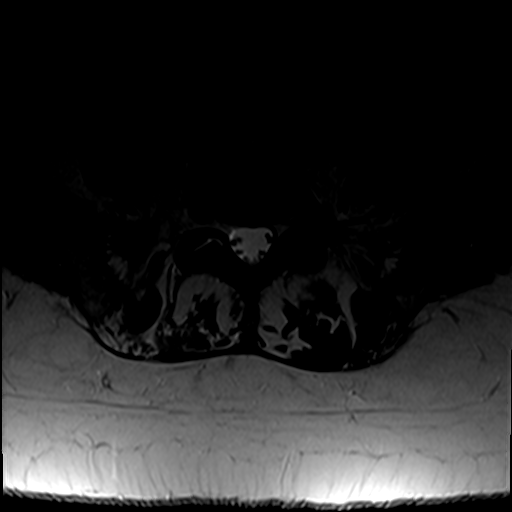
[im 12/39]
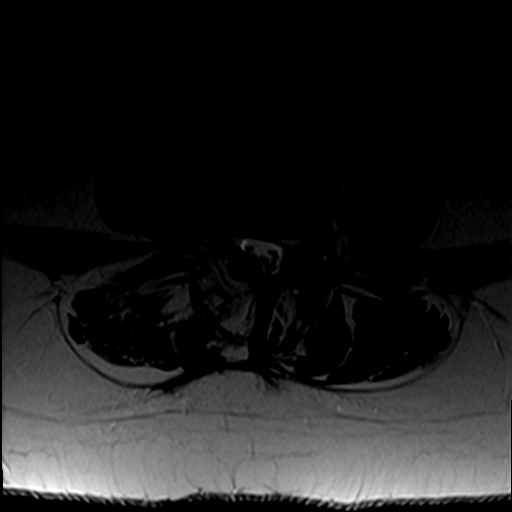
[im 18/39]
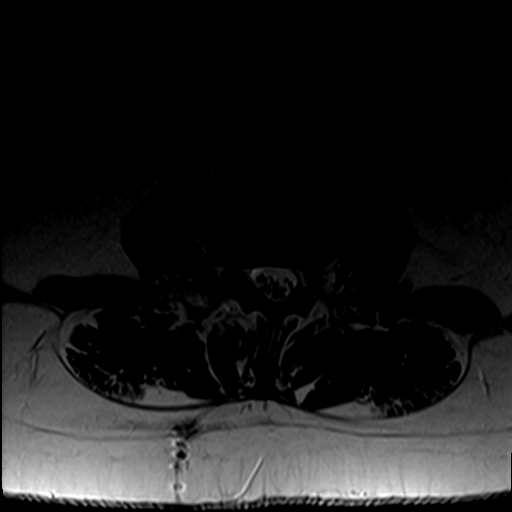
[im 21/39]
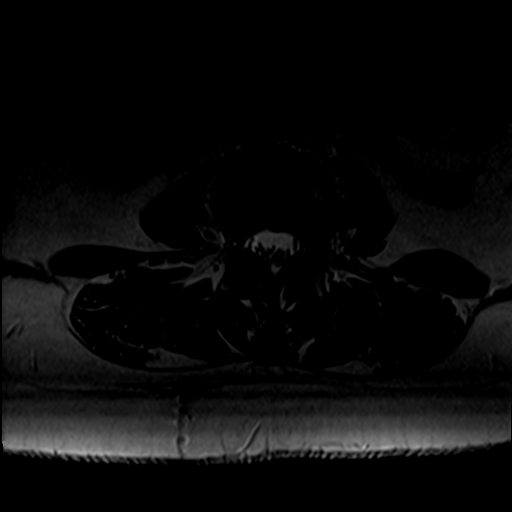
[im 27/39]
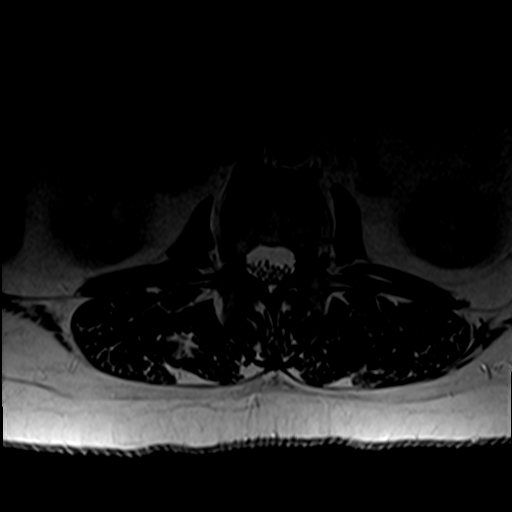
[im 33/39]
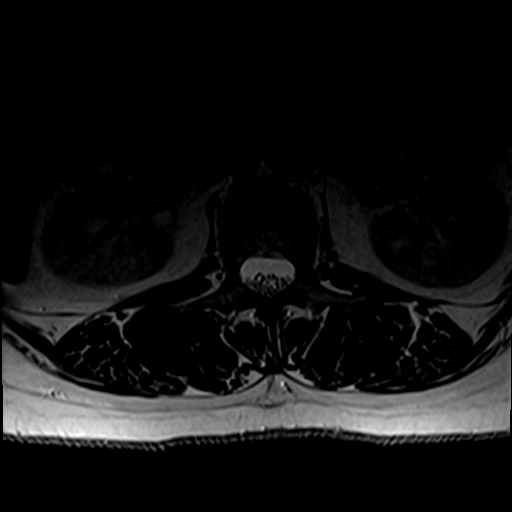
[im 39/39]
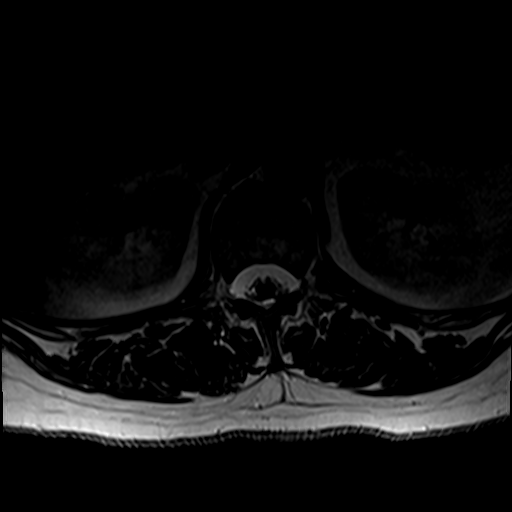

[Series 6: T1 · axial · 4.0mm · 0.31mm/px · z∈[-14,+136]mm · 3 of 39 slices shown (2 of 2)]
[im 6/39]
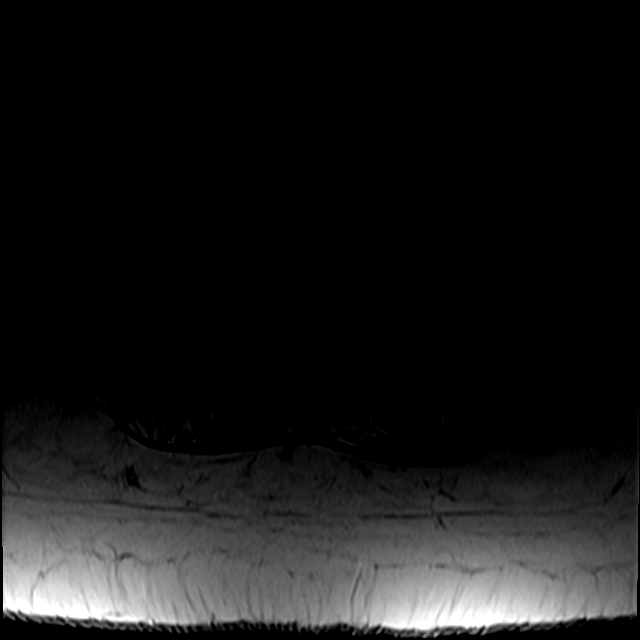
[im 21/39]
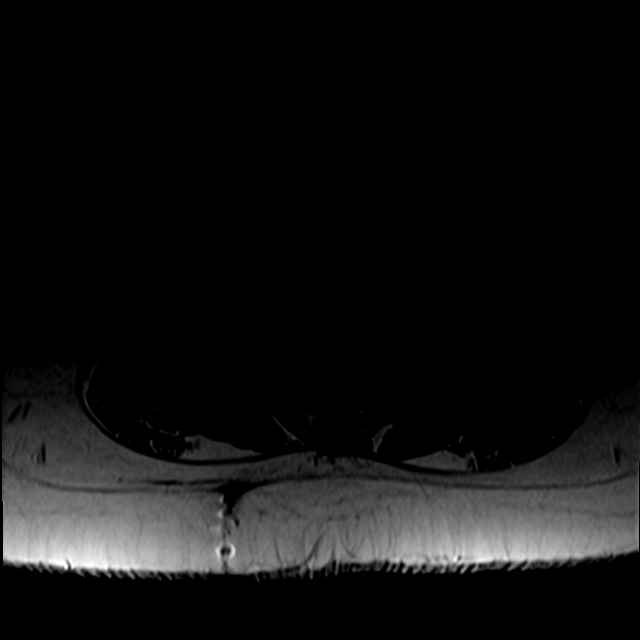
[im 33/39]
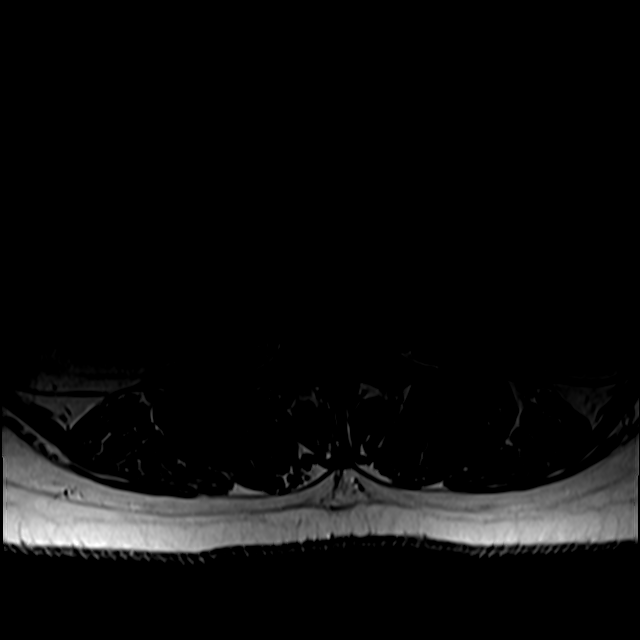

[24 of 48 positions shown; findings below may reference images not displayed]

FINDINGS: Segmentation:  Normal

Alignment:  Mild retrolisthesis L2-3, L3-4, and L4-5

Vertebrae:  Negative for fracture or mass

Conus medullaris and cauda equina: Conus extends to the L1 level.
Conus and cauda equina appear normal.

Paraspinal and other soft tissues: Negative for mass or fluid
collection. Postoperative edema on the right at L3-4 due to
extraforaminal discectomy.

Disc levels:

L1-2: Small left paracentral disc protrusion unchanged. Negative for
stenosis.

L2-3: Small left-sided disc protrusion unchanged without stenosis

L3-4: Progressive disc space narrowing on the right. Progressive
endplate edema on the right. Extraforaminal disc exploration on the
right with partial facetectomy. Interval removal of right foraminal
disc protrusion. No recurrent disc protrusion. Moderate subarticular
foraminal stenosis remains in the right due to spurring and facet
hypertrophy. Mild spinal stenosis unchanged.

L4-5: Moderate disc degeneration and facet degeneration unchanged.
Progressive subarticular stenosis on the left due to new disc
protrusion. Mild spinal stenosis.

L5-S1: Mild disc degeneration and diffuse bulging of the disc. Small
central disc protrusion unchanged. Small extraforaminal disc
protrusion displacing the left L5 nerve root unchanged.
IMPRESSION: Small left-sided disc protrusions L1-2 and L2-3 unchanged

Right L3-4 extraforaminal discectomy without recurrent disc
protrusion. Moderate subarticular foraminal stenosis remains due to
spurring. Mild spinal stenosis unchanged.

Progressive subarticular stenosis on the left at L4-5 due to disc
protrusion also causing mild spinal stenosis

Small extraforaminal disc protrusion on the left L5-S1 unchanged.
